# Patient Record
Sex: Male | Born: 1995 | Race: White | Hispanic: No | Marital: Single | Smoking: Never smoker
Health system: Southern US, Community
[De-identification: ages and names within clinical notes are randomized; demographics above are authoritative.]

## PROBLEM LIST (undated history)

## (undated) HISTORY — PX: SPINAL CORD DECOMPRESSION: SHX97

---
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
## 2018-09-10 ENCOUNTER — Ambulatory Visit (INDEPENDENT_AMBULATORY_CARE_PROVIDER_SITE_OTHER): Payer: BC Managed Care – PPO

## 2018-09-10 ENCOUNTER — Ambulatory Visit
Admission: EM | Admit: 2018-09-10 | Discharge: 2018-09-10 | Disposition: A | Discharge status: Home / Self Care | Payer: BC Managed Care – PPO | Attending: Family Medicine | Admitting: Family Medicine

## 2018-09-10 DIAGNOSIS — S61210A Laceration without foreign body of right index finger without damage to nail, initial encounter: Secondary | ICD-10-CM

## 2018-09-10 DIAGNOSIS — W298XXA Contact with other powered powered hand tools and household machinery, initial encounter: Secondary | ICD-10-CM | POA: Diagnosis not present

## 2018-09-10 MED ORDER — LIDOCAINE HCL (PF) 1 % IJ SOLN: 5.0000 mL | Freq: Once | INTRAMUSCULAR | Status: DC

## 2018-09-10 MED ORDER — CEPHALEXIN 500 MG PO CAPS: 500.0000 mg | ORAL_CAPSULE | Freq: Three times a day (TID) | ORAL | 0 refills | Status: AC

## 2018-09-10 NOTE — ED Provider Notes (Addendum)
[facility] ____________________________________________  Time seen: Approximately 8:48 PM  I have reviewed the triage vital signs and the nursing notes.   HISTORY  Chief Complaint Extremity Laceration   HPI Gabriel Murray is a 23 y.o. male presenting for evaluation of right index finger laceration that occurred post injury that occurred just prior to arrival at home.  Patient was working with a drill, the drill kicked back causing his hand to hit between the drill and solid object causing pain and injury.  Tetanus immunization is up-to-date, stating this past fall was given.  States mild pain to laceration site.  No alleviating measures attempted prior to arrival.  States continues with full range of motion, denies paresthesias or pain radiation.  Denies other injury.  Right-hand-dominant.  Reports otherwise doing well denies other complaints.   History reviewed. No pertinent past medical history. Denies There are no active problems to display for this patient.   Past Surgical History:  Procedure Laterality Date  . SPINAL CORD DECOMPRESSION        Current Facility-Administered Medications:  .  lidocaine (PF) (XYLOCAINE) 1 % injection 5 mL, 5 mL, Other, Once, Renford Dills, NP  Current Outpatient Medications:  .  cephALEXin (KEFLEX) 500 MG capsule, Take 1 capsule (500 mg total) by mouth 3 (three) times daily for 5 days., Disp: 15 capsule, Rfl: 0  Allergies Patient has no known allergies.  Family History  Problem Relation Age of Onset  . Healthy Mother   . Healthy Father     Social History Social History   Tobacco Use  . Smoking status: Never Smoker  . Smokeless tobacco: Never Used  Substance Use Topics  . Alcohol use: Yes  . Drug use: Not on file    Review of Systems Constitutional: No fever Cardiovascular: Denies chest pain. Respiratory: Denies shortness of breath. Musculoskeletal: Negative for back pain. Skin: Positive for  laceration   ____________________________________________   PHYSICAL EXAM:  VITAL SIGNS: ED Triage Vitals  Enc Vitals Group     BP 09/10/18 1842 120/83     Pulse Rate 09/10/18 1842 70     Resp 09/10/18 1842 18     Temp 09/10/18 1842 98.9 F (37.2 C)     Temp Source 09/10/18 1842 Oral     SpO2 09/10/18 1842 100 %     Weight 09/10/18 1845 150 lb (68 kg)     Height 09/10/18 1845 5\' 5"  (1.651 m)     Head Circumference --      Peak Flow --      Pain Score 09/10/18 1845 6     Pain Loc --      Pain Edu? --      Excl. in GC? --     Constitutional: Alert and oriented. Well appearing and in no acute distress. ENT      Head: Normocephalic and atraumatic. Cardiovascular: Normal rate, regular rhythm. Grossly normal heart sounds.  Good peripheral circulation. Respiratory: Normal respiratory effort without tachypnea nor retractions. Breath sounds are clear and equal bilaterally. No wheezes, rales, rhonchi. Musculoskeletal: Steady gait. Neurologic:  Normal speech and language. Speech is normal. No gait instability.  Skin:  Skin is warm, dry. Except: Right index finger dorsal aspect along PIP joint 3 cm diagonal laceration over the joint with mild active bleeding, no foreign body noted, no tendon injury visualized, normal distal sensation capillary refill, full range of motion present, no motor or tendon deficit noted, right hand otherwise nontender. Psychiatric: Mood and affect  are normal. Speech and behavior are normal. Patient exhibits appropriate insight and judgment   ___________________________________________   LABS (all labs ordered are listed, but only abnormal results are displayed)  Labs Reviewed - No data to display ____________________________________________  RADIOLOGY  Dg Finger Index Right  Result Date: 09/10/2018 CLINICAL DATA:  Laceration with drill EXAM: RIGHT INDEX FINGER 2+V COMPARISON:  None. FINDINGS: Frontal, oblique, and lateral views were obtained. There is  soft tissue swelling in the PIP joint region dorsally. No radiopaque foreign body. No fracture or dislocation. No joint space narrowing or erosion. IMPRESSION: Soft tissue swelling in the PIP joint region dorsally. No evident radiopaque foreign body. No fracture or dislocation. No evident arthropathy. Electronically Signed   By: Bretta Bang III M.D.   On: 09/10/2018 20:17   ____________________________________________   PROCEDURES Procedures   Procedure(s) performed:  Procedure explained and verbal consent obtained. Consent: Verbal consent obtained. Written consent not obtained. Risks and benefits: risks, benefits and alternatives were discussed Patient identity confirmed: verbally with patient and hospital-assigned identification number  Consent given by: patient   Laceration Repair Location: Right index finger Length: 3 cm Foreign bodies: no foreign bodies Tendon involvement: none Nerve involvement: none Preparation: Patient was prepped and draped in the usual sterile fashion. Anesthesia with 1% Lidocaine 5  Mls Cleaned with betadine Irrigation solution: saline Irrigation method: jet lavage Amount of cleaning: copious Repaired with 5-0 nylon Number of sutures: 5 Technique: simple interrupted  Approximation: loose Patient tolerate well. Wound well approximated post repair.  Antibiotic ointment and dressing applied.  Wound care instructions provided.  Observe for any signs of infection or other problems.      INITIAL IMPRESSION / ASSESSMENT AND PLAN / ED COURSE  Pertinent labs & imaging results that were available during my care of the patient were reviewed by me and considered in my medical decision making (see chart for details).  Well-appearing patient.  No acute distress.  Right index finger laceration due to mechanical injury that occurred just prior to arrival.  Tetanus immunization up-to-date.  Right index finger x-ray as above per radiology, negative for acute  osseous abnormality or radiopaque foreign body.  Copiously cleaned and irrigated and repaired as above.  Finger splint also given to use for 3 to 4 days to protect sutures.  Topical over-the-counter antibiotic ointment, discussed keeping clean, and due to tool use, 5-day Keflex given.  Supportive care.  Return to urgent care in 7 to 10 days for suture removal.  Sooner as needed.Discussed indication, risks and benefits of medications with patient.  Discussed follow up with Primary care physician this week. Discussed follow up and return parameters including no resolution or any worsening concerns. Patient verbalized understanding and agreed to plan.   ____________________________________________   FINAL CLINICAL IMPRESSION(S) / ED DIAGNOSES  Final diagnoses:  Laceration of right index finger without foreign body without damage to nail, initial encounter     ED Discharge Orders         Ordered    cephALEXin (KEFLEX) 500 MG capsule  3 times daily     09/10/18 2042           Note: This dictation was prepared with Dragon dictation along with smaller phrase technology. Any transcriptional errors that result from this process are unintentional.        Renford Dills, NP 09/10/18 2053

## 2018-09-10 NOTE — ED Triage Notes (Signed)
Pt was working on a trailer an hour ago and got his finger stuck between the nail and the trailer and has a laceration on his right index finger. Laceration is right across his knuckle but still able to move his finger

## 2018-09-10 NOTE — Discharge Instructions (Addendum)
Take medication as prescribed. Rest. Drink plenty of fluids.  Keep clean.  Use finger splint for 3 to 4 days.  Suture removal in 7 to 10 days.  Return sooner as needed.  Follow up with your primary care physician this week as needed. Return to Urgent care for new or worsening concerns.

## 2018-09-20 ENCOUNTER — Ambulatory Visit
Admission: EM | Admit: 2018-09-20 | Discharge: 2018-09-20 | Disposition: A | Discharge status: Home / Self Care | Payer: BC Managed Care – PPO

## 2018-09-20 DIAGNOSIS — S61210D Laceration without foreign body of right index finger without damage to nail, subsequent encounter: Secondary | ICD-10-CM

## 2018-09-20 DIAGNOSIS — Z4802 Encounter for removal of sutures: Secondary | ICD-10-CM

## 2018-09-20 NOTE — ED Triage Notes (Signed)
Patient in today to have sutures removed from his right index finger. 5 sutures were placed on 09/10/18. 5 sutures removed. Patient tolerated without complaint.

## 2019-08-17 IMAGING — CR DG FINGER INDEX 2+V*R*
3 series · 4 of 4 positions shown · non-contrast
Comparison: None.

CLINICAL DATA: Laceration with drill

EXAM:
RIGHT INDEX FINGER 2+V

[finger ap]
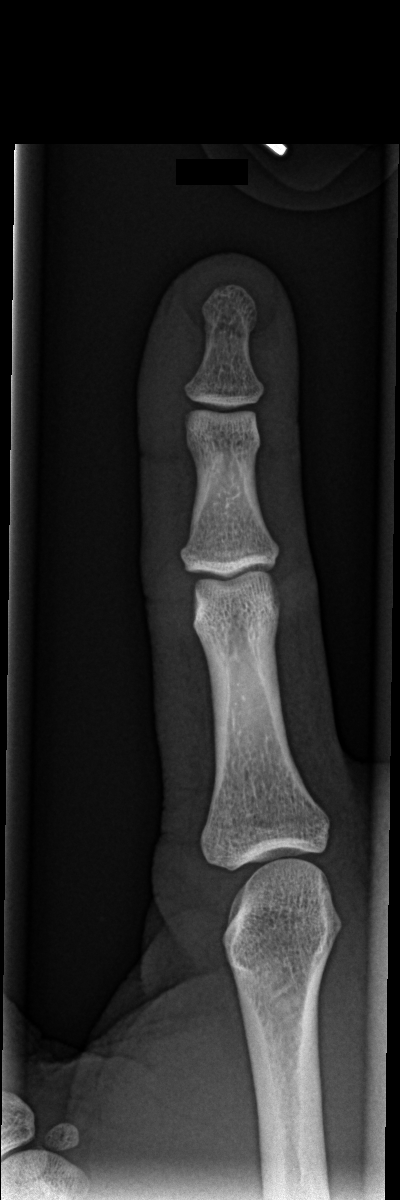

[finger obl]
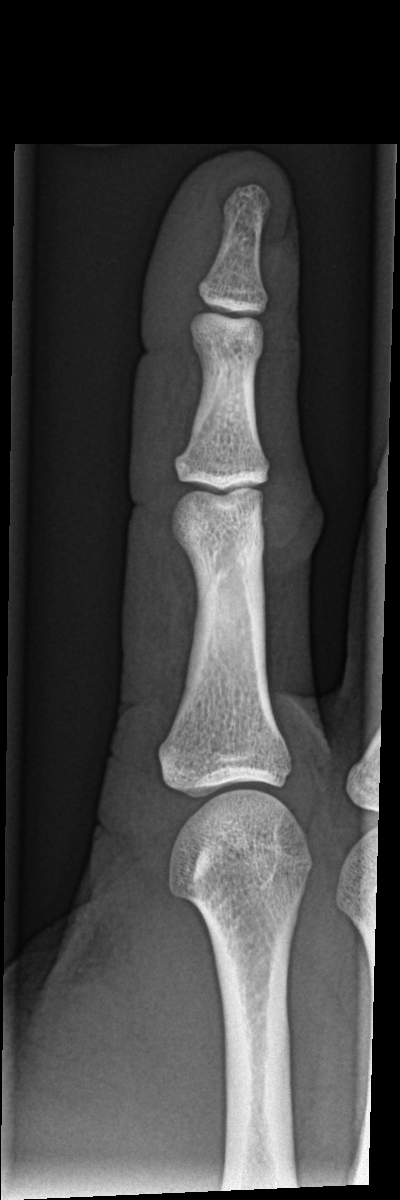

[Series 3: finger lat · 0.14mm/px · 2 of 2 slices shown]
[im 1/2]
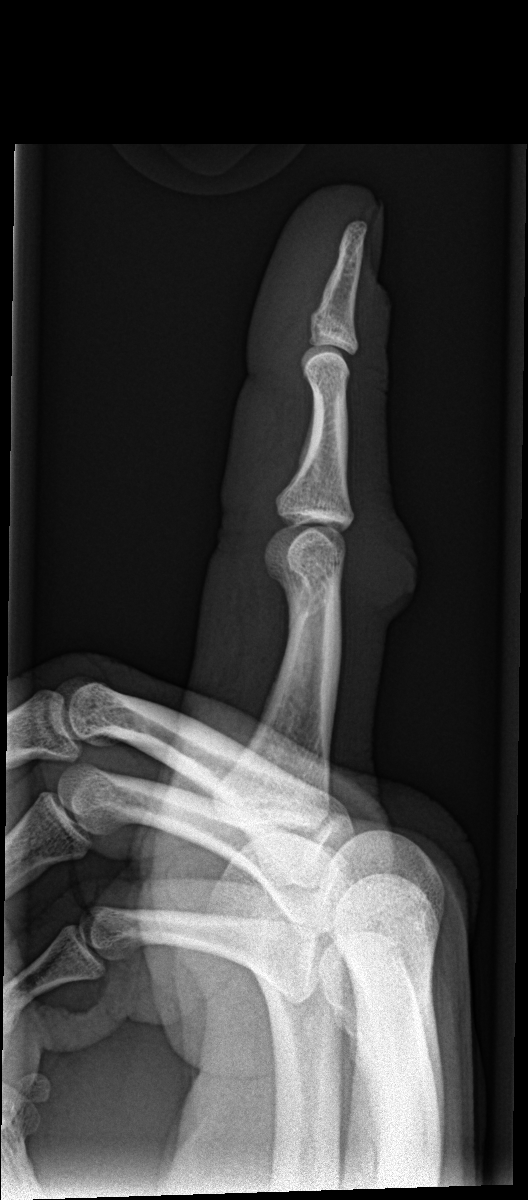
[im 2/2]
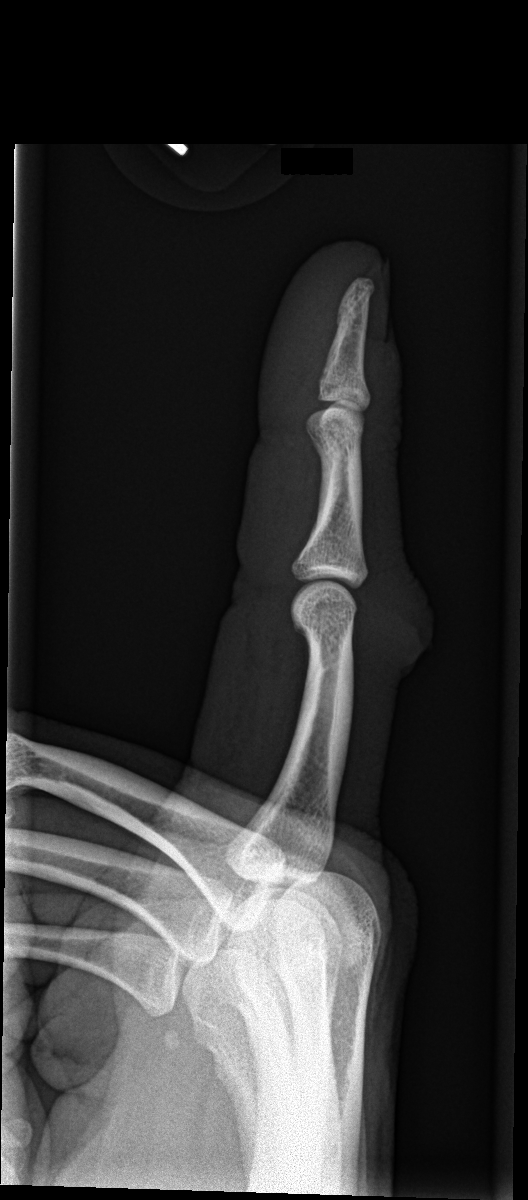

[4 of 4 positions shown; findings below may reference images not displayed]

FINDINGS: Frontal, oblique, and lateral views were obtained. There is soft
tissue swelling in the PIP joint region dorsally. No radiopaque
foreign body. No fracture or dislocation. No joint space narrowing
or erosion.
IMPRESSION: Soft tissue swelling in the PIP joint region dorsally. No evident
radiopaque foreign body. No fracture or dislocation. No evident
arthropathy.
